# Patient Record
Sex: Female | Born: 1995 | Race: Black or African American | Hispanic: No | Marital: Single
Health system: Southern US, Community
[De-identification: ages and names within clinical notes are randomized; demographics above are authoritative.]

## PROBLEM LIST (undated history)

## (undated) HISTORY — PX: UPPER GI ENDOSCOPY: SHX6162

---
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
## 2017-12-29 ENCOUNTER — Inpatient Hospital Stay (HOSPITAL_COMMUNITY)
Admission: AD | Admit: 2017-12-29 | Discharge: 2017-12-29 | Disposition: A | Discharge status: Home / Self Care | Payer: Self-pay | Source: Ambulatory Visit | Attending: Obstetrics & Gynecology | Admitting: Obstetrics & Gynecology

## 2017-12-29 DIAGNOSIS — R102 Pelvic and perineal pain: Secondary | ICD-10-CM | POA: Insufficient documentation

## 2017-12-29 DIAGNOSIS — Z791 Long term (current) use of non-steroidal anti-inflammatories (NSAID): Secondary | ICD-10-CM | POA: Insufficient documentation

## 2017-12-29 DIAGNOSIS — Z113 Encounter for screening for infections with a predominantly sexual mode of transmission: Secondary | ICD-10-CM | POA: Insufficient documentation

## 2017-12-29 DIAGNOSIS — N9489 Other specified conditions associated with female genital organs and menstrual cycle: Secondary | ICD-10-CM

## 2017-12-29 DIAGNOSIS — N760 Acute vaginitis: Secondary | ICD-10-CM | POA: Insufficient documentation

## 2017-12-29 DIAGNOSIS — Z79899 Other long term (current) drug therapy: Secondary | ICD-10-CM | POA: Insufficient documentation

## 2017-12-29 DIAGNOSIS — B9689 Other specified bacterial agents as the cause of diseases classified elsewhere: Secondary | ICD-10-CM | POA: Insufficient documentation

## 2017-12-29 DIAGNOSIS — Z7722 Contact with and (suspected) exposure to environmental tobacco smoke (acute) (chronic): Secondary | ICD-10-CM | POA: Insufficient documentation

## 2017-12-29 LAB — URINALYSIS, ROUTINE W REFLEX MICROSCOPIC
Bacteria, UA: NONE SEEN
Bilirubin Urine: NEGATIVE
Glucose, UA: NEGATIVE mg/dL
Hgb urine dipstick: NEGATIVE
Ketones, ur: 5 mg/dL — AB
Nitrite: NEGATIVE
Protein, ur: NEGATIVE mg/dL
Specific Gravity, Urine: 1.026 (ref 1.005–1.030)
pH: 5 (ref 5.0–8.0)

## 2017-12-29 LAB — WET PREP, GENITAL
Sperm: NONE SEEN
Trich, Wet Prep: NONE SEEN
Yeast Wet Prep HPF POC: NONE SEEN

## 2017-12-29 LAB — POCT PREGNANCY, URINE: Preg Test, Ur: NEGATIVE

## 2017-12-29 MED ORDER — TINIDAZOLE 500 MG PO TABS: 2.0000 g | ORAL_TABLET | Freq: Every day | ORAL | 0 refills | Status: DC

## 2017-12-29 NOTE — MAU Note (Signed)
Pt reports milky white discharge that she noticed 5 days ago. Reports some itching and odor. Pt reports a bump on her left labia. States the skin looks swollen around that area. Reports that she did scratch that area at one point. Pt also reports some burning in her legs that started last night but worse today. Pt denies urinary s/s.

## 2017-12-29 NOTE — Discharge Instructions (Signed)
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
## 2017-12-31 LAB — GC/CHLAMYDIA PROBE AMP (~~LOC~~) NOT AT ARMC
Chlamydia: NEGATIVE
Neisseria Gonorrhea: NEGATIVE

## 2018-01-01 ENCOUNTER — Other Ambulatory Visit: Payer: Self-pay | Admitting: Certified Nurse Midwife

## 2018-01-01 DIAGNOSIS — B009 Herpesviral infection, unspecified: Secondary | ICD-10-CM

## 2018-01-01 LAB — HSV CULTURE AND TYPING

## 2018-01-01 MED ORDER — VALACYCLOVIR HCL 1 G PO TABS: 1000.0000 mg | ORAL_TABLET | Freq: Two times a day (BID) | ORAL | 1 refills | Status: DC

## 2018-01-01 MED ORDER — ACYCLOVIR 800 MG PO TABS: 800.0000 mg | ORAL_TABLET | Freq: Two times a day (BID) | ORAL | 0 refills | Status: AC

## 2018-01-01 NOTE — Progress Notes (Signed)
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 Patient was called to notify of results, allergies and pharmacy confirmed. Rx sent to pharmacy of choice for initial treatment of outbreak plus additional refill.   Sharyon Cable, CNM 01/01/2018 9:49 PM

## 2018-01-01 NOTE — Addendum Note (Signed)
Addended by: Sharyon Cable on: 01/01/2018 10:02 PM   Modules accepted: Orders

## 2018-08-21 ENCOUNTER — Other Ambulatory Visit: Payer: Self-pay

## 2018-08-21 ENCOUNTER — Emergency Department (HOSPITAL_COMMUNITY)
Admission: EM | Admit: 2018-08-21 | Discharge: 2018-08-21 | Disposition: A | Discharge status: Home / Self Care | Payer: Self-pay | Attending: Emergency Medicine | Admitting: Emergency Medicine

## 2018-08-21 ENCOUNTER — Emergency Department (HOSPITAL_COMMUNITY): Payer: Self-pay

## 2018-08-21 DIAGNOSIS — Z7722 Contact with and (suspected) exposure to environmental tobacco smoke (acute) (chronic): Secondary | ICD-10-CM | POA: Insufficient documentation

## 2018-08-21 DIAGNOSIS — R05 Cough: Secondary | ICD-10-CM | POA: Insufficient documentation

## 2018-08-21 DIAGNOSIS — J069 Acute upper respiratory infection, unspecified: Secondary | ICD-10-CM | POA: Insufficient documentation

## 2018-08-21 LAB — URINALYSIS, ROUTINE W REFLEX MICROSCOPIC
Bilirubin Urine: NEGATIVE
Glucose, UA: NEGATIVE mg/dL
Hgb urine dipstick: NEGATIVE
Ketones, ur: NEGATIVE mg/dL
Leukocytes,Ua: NEGATIVE
Nitrite: NEGATIVE
Protein, ur: NEGATIVE mg/dL
Specific Gravity, Urine: 1.001 — ABNORMAL LOW (ref 1.005–1.030)
pH: 6 (ref 5.0–8.0)

## 2018-08-21 LAB — PREGNANCY, URINE: Preg Test, Ur: NEGATIVE

## 2018-08-21 NOTE — ED Provider Notes (Signed)
[facility] EMERGENCY DEPARTMENT Provider Note   CSN: [id number] Arrival date & time: 08/21/18  1811    History   Chief Complaint Chief Complaint  Patient presents with  . Chest Pain    HPI Andrea Lowe is a 23 y.o. female.     Patient is a 23 year old female with no past medical history presenting to the emergency department for chest pain.  Patient reports that the chest pain started this morning while she was doing nothing.  Reports that she had a coughing spell although she was laughing the other day and coughed up some mucus.  She reports that the chest pain feels like pressure.  She took ibuprofen 2 hours ago and now the chest pain is gone.  Reports that her last menstrual period was over 4 weeks ago.  Denies any vaginal discharge, vaginal bleeding, abdominal pain, dysuria, hematuria.  Denies any fever, chills.  Reports that she felt like she might of had a fever but did not record her temperature.  Denies any sick contacts or recent travel.  She does smoke marijuana occasionally, last time was 3 days ago.     No past medical history on file.  There are no active problems to display for this patient.   Past Surgical History:  Procedure Laterality Date  . UPPER GI ENDOSCOPY     to remove a quarter     OB History   No obstetric history on file.      Home Medications    Prior to Admission medications   Medication Sig Start Date End Date Taking? Authorizing Provider  Chlorpheniramine-PSE-Ibuprofen (ADVIL ALLERGY SINUS) 2-30-200 MG TABS Take 2 tablets by mouth daily as needed (for allergies).    [provider]  ibuprofen (ADVIL,MOTRIN) 400 MG tablet Take 1 tablet (400 mg total) by mouth every 6 (six) hours as needed for mild pain. 04/06/13   Marcellina Millin, MD  tinidazole (TINDAMAX) 500 MG tablet Take 4 tablets (2,000 mg total) by mouth daily with breakfast. For two days 12/29/17   Sharyon Cable, CNM  valACYclovir (VALTREX) 1000 MG tablet  Take 1 tablet (1,000 mg total) by mouth 2 (two) times daily. Take for ten days. 01/01/18   Sharyon Cable, CNM    Family History No family history on file.  Social History Social History   Tobacco Use  . Smoking status: Passive Smoke Exposure - Never Smoker  Substance Use Topics  . Alcohol use: Not on file  . Drug use: Not on file     Allergies   Patient has no known allergies.   Review of Systems Review of Systems  Constitutional: Negative for appetite change, chills, diaphoresis and fever.  HENT: Negative for congestion, ear pain, nosebleeds, rhinorrhea and sore throat.   Eyes: Negative for pain and visual disturbance.  Respiratory: Positive for cough. Negative for apnea, chest tightness, shortness of breath and wheezing.   Cardiovascular: Positive for chest pain. Negative for palpitations and leg swelling.  Gastrointestinal: Negative for abdominal pain, diarrhea, nausea and vomiting.  Genitourinary: Negative for difficulty urinating, dysuria, hematuria, vaginal bleeding, vaginal discharge and vaginal pain.  Musculoskeletal: Negative for arthralgias and back pain.  Skin: Negative for color change and rash.  Allergic/Immunologic: Negative for environmental allergies, food allergies and immunocompromised state.  Neurological: Negative for dizziness, seizures, syncope and headaches.  All other systems reviewed and are negative.    Physical Exam Updated Vital Signs BP 130/74 (BP Location: Right Arm)   Pulse Marland Kitchen)  107   Temp 98.5 F (36.9 C) (Oral)   Resp 16   Ht 5\' 4"  (1.626 m)   Wt 61.2 kg   LMP 07/10/2018   SpO2 98%   BMI 23.17 kg/m   Physical Exam Vitals signs and nursing note reviewed.  Constitutional:      General: She is not in acute distress.    Appearance: She is well-developed.  HENT:     Head: Normocephalic and atraumatic.  Eyes:     Conjunctiva/sclera: Conjunctivae normal.     Pupils: Pupils are equal, round, and reactive to light.  Neck:      Musculoskeletal: Neck supple.  Cardiovascular:     Rate and Rhythm: Normal rate and regular rhythm.     Heart sounds: No murmur.  Pulmonary:     Effort: Pulmonary effort is normal. No respiratory distress.     Breath sounds: Normal breath sounds. No decreased breath sounds, wheezing, rhonchi or rales.  Chest:     Chest wall: No mass or tenderness.  Abdominal:     Palpations: Abdomen is soft.     Tenderness: There is no abdominal tenderness.  Skin:    General: Skin is warm and dry.     Capillary Refill: Capillary refill takes less than 2 seconds.  Neurological:     General: No focal deficit present.     Mental Status: She is alert.      ED Treatments / Results  Labs (all labs ordered are listed, but only abnormal results are displayed) Labs Reviewed  URINALYSIS, ROUTINE W REFLEX MICROSCOPIC - Abnormal; Notable for the following components:      Result Value   Color, Urine STRAW (*)    APPearance HAZY (*)    Specific Gravity, Urine 1.001 (*)    All other components within normal limits  PREGNANCY, URINE    EKG EKG Interpretation  Date/Time:  Thursday August 21 2018 18:54:12 EDT Ventricular Rate:  95 PR Interval:    QRS Duration: 59 QT Interval:  321 QTC Calculation: 404 R Axis:   84 Text Interpretation:  Sinus rhythm Right atrial enlargement since last tracing no significant change Confirmed by Mancel BaleWentz, Elliott [phone]([id number]) on 08/21/2018 6:58:12 PM   Radiology Dg Chest Portable 1 View  Result Date: 08/21/2018 CLINICAL DATA:  Chest pain EXAM: PORTABLE CHEST 1 VIEW COMPARISON:  None. FINDINGS: Heart and mediastinal contours are within normal limits. No focal opacities or effusions. No acute bony abnormality. IMPRESSION: No active cardiopulmonary disease. Electronically Signed   By: Charlett NoseKevin  Dover M.D.   On: 08/21/2018 19:08    Procedures Procedures (including critical care time)  Medications Ordered in ED Medications - No data to display   Initial Impression / Assessment  and Plan / ED Course  I have reviewed the triage vital signs and the nursing notes.  Pertinent labs & imaging results that were available during my care of the patient were reviewed by me and considered in my medical decision making (see chart for details).  Clinical Course as of Aug 20 1921  Thu Aug 21, 2018  [id number] Patient likely has viral URI given her symptoms.  Currently while in the emergency department she is symptom-free.  She has been afebrile.  Due to our attesting restrictions, I cannot rule out coronavirus.  This was discussed with the patient and it was discussed with her about home, self quarantine.  And return precautions.Olam Idler.   Andrea Lowe was evaluated in Emergency Department on 08/21/2018 for the symptoms described  in the history of present illness. She was evaluated in the context of the global COVID-19 pandemic, which necessitated consideration that the patient might be at risk for infection with the SARS-CoV-2 virus that causes COVID-19. Institutional protocols and algorithms that pertain to the evaluation of patients at risk for COVID-19 are in a state of rapid change based on information released by regulatory bodies including the CDC and federal and state organizations. These policies and algorithms were followed during the patient's care in the ED.     [KM]    Clinical Course User Index [KM] Arlyn Dunning, PA-C       Based on review of vitals, medical screening exam, lab work and/or imaging, there does not appear to be an acute, emergent etiology for the patient's symptoms. Counseled pt on good return precautions and encouraged both PCP and ED follow-up as needed.  Prior to discharge, I also discussed incidental imaging findings with patient in detail and advised appropriate, recommended follow-up in detail.  Clinical Impression: 1. Acute URI     Disposition: Discharge  Prior to providing a prescription for a controlled substance, I independently reviewed the  patient's recent prescription history on the [place] Controlled Substance Reporting System. The patient had no recent or regular prescriptions and was deemed appropriate for a brief, less than 3 day prescription of narcotic for acute analgesia.  This note was prepared with assistance of Conservation officer, historic buildings. Occasional wrong-word or sound-a-like substitutions may have occurred due to the inherent limitations of voice recognition software.   Final Clinical Impressions(s) / ED Diagnoses   Final diagnoses:  Acute URI    ED Discharge Orders    None       Jeral Pinch 08/21/18 Margretta Ditty    Mancel Bale, MD 08/22/18 [phone]

## 2018-08-21 NOTE — Discharge Instructions (Addendum)
Unfortunately we do not have enough tests to test you for the coronavirus.  Your symptoms are very mild but we cannot rule out the coronavirus so please self quarantine for 7 days from symptom onset and until you are fever free for at least 72 hours    Stay home except to get medical care You should restrict activities outside your home, except for getting medical care. Do not go to work, school, or public areas, and do not use public transportation or taxis.  Call ahead before visiting your doctor Before your medical appointment, call the healthcare provider and tell them that you have viral illness symptoms  Monitor your symptoms Seek prompt medical attention if your illness is worsening (e.g., difficulty breathing).   Wear a facemask You should wear a facemask that covers your nose and mouth when you are in the same room with other people and when you visit a healthcare provider. People who live with or visit you should also wear a facemask while they are in the same room with you.  Separate yourself from other people in your home As much as possible, you should stay in a different room from other people in your home. Also, you should use a separate bathroom, if available.  Avoid sharing household items You should not share dishes, drinking glasses, cups, eating utensils, towels, bedding, or other items with other people in your home. After using these items, you should wash them thoroughly with soap and water.  Cover your coughs and sneezes Cover your mouth and nose with a tissue when you cough or sneeze, or you can cough or sneeze into your sleeve. Throw used tissues in a lined trash can, and immediately wash your hands with soap and water for at least 20 seconds or use an alcohol-based hand rub.  Wash your [employer] your hands often and thoroughly with soap and water for at least 20 seconds. You can use an alcohol-based hand sanitizer if soap and water are not available and if your  hands are not visibly dirty. Avoid touching your eyes, nose, and mouth with unwashed hands.   Thank you for allowing me to care for you today. Please return to the emergency department if you have new or worsening symptoms. Take your medications as instructed.

## 2018-08-21 NOTE — ED Triage Notes (Addendum)
Pt c/o chest tightness that began this morning ; denies any SOB; states she had a slight cough and a runny nose a couple of days ago ; but denies any further cough or runny nose at this time

## 2020-02-11 IMAGING — CR PORTABLE CHEST - 1 VIEW
1 series · 1 of 1 positions shown · non-contrast
Comparison: None.

CLINICAL DATA: Chest pain

EXAM:
PORTABLE CHEST 1 VIEW

[AP]
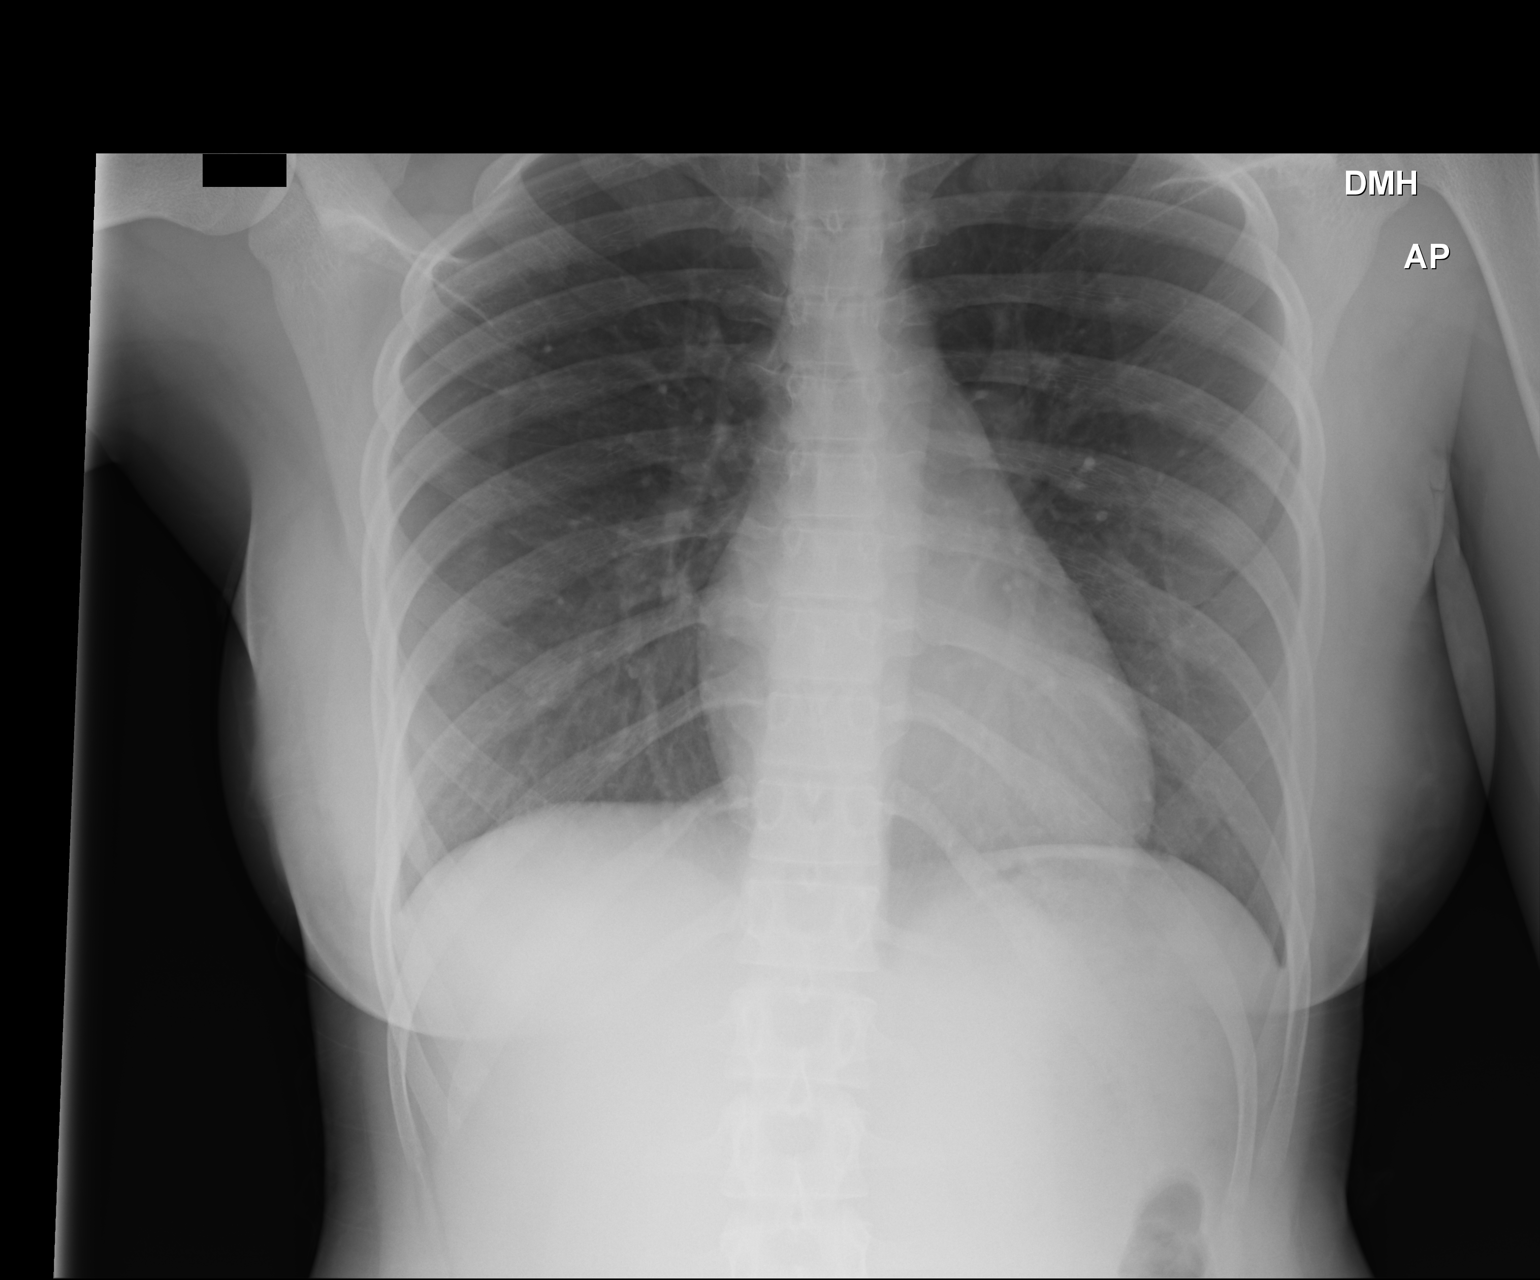

[1 of 1 positions shown; findings below may reference images not displayed]

FINDINGS: Heart and mediastinal contours are within normal limits. No focal
opacities or effusions. No acute bony abnormality.
IMPRESSION: No active cardiopulmonary disease.

## 2020-03-22 ENCOUNTER — Emergency Department (HOSPITAL_COMMUNITY)
Admission: EM | Admit: 2020-03-22 | Discharge: 2020-03-22 | Disposition: A | Discharge status: Home / Self Care | Payer: Self-pay | Attending: Emergency Medicine | Admitting: Emergency Medicine

## 2020-03-22 ENCOUNTER — Encounter (HOSPITAL_COMMUNITY): Payer: Self-pay

## 2020-03-22 DIAGNOSIS — N12 Tubulo-interstitial nephritis, not specified as acute or chronic: Secondary | ICD-10-CM | POA: Insufficient documentation

## 2020-03-22 DIAGNOSIS — Z7722 Contact with and (suspected) exposure to environmental tobacco smoke (acute) (chronic): Secondary | ICD-10-CM | POA: Insufficient documentation

## 2020-03-22 DIAGNOSIS — B3731 Acute candidiasis of vulva and vagina: Secondary | ICD-10-CM

## 2020-03-22 DIAGNOSIS — B373 Candidiasis of vulva and vagina: Secondary | ICD-10-CM | POA: Insufficient documentation

## 2020-03-22 LAB — COMPREHENSIVE METABOLIC PANEL
ALT: 12 U/L (ref 0–44)
AST: 16 U/L (ref 15–41)
Albumin: 3.8 g/dL (ref 3.5–5.0)
Alkaline Phosphatase: 53 U/L (ref 38–126)
Anion gap: 8 (ref 5–15)
BUN: 12 mg/dL (ref 6–20)
CO2: 23 mmol/L (ref 22–32)
Calcium: 9.3 mg/dL (ref 8.9–10.3)
Chloride: 103 mmol/L (ref 98–111)
Creatinine, Ser: 0.88 mg/dL (ref 0.44–1.00)
GFR, Estimated: >60 mL/min (ref >60)
Glucose, Bld: 108 mg/dL — ABNORMAL HIGH (ref 70–99)
Potassium: 4.6 mmol/L (ref 3.5–5.1)
Sodium: 134 mmol/L — ABNORMAL LOW (ref 135–145)
Total Bilirubin: 0.8 mg/dL (ref 0.3–1.2)
Total Protein: 7 g/dL (ref 6.5–8.1)

## 2020-03-22 LAB — URINALYSIS, ROUTINE W REFLEX MICROSCOPIC
Bilirubin Urine: NEGATIVE
Glucose, UA: NEGATIVE mg/dL
Ketones, ur: NEGATIVE mg/dL
Nitrite: NEGATIVE
Protein, ur: 30 mg/dL — AB
Specific Gravity, Urine: 1.014 (ref 1.005–1.030)
WBC, UA: >50 WBC/hpf — ABNORMAL HIGH (ref 0–5)
pH: 5 (ref 5.0–8.0)

## 2020-03-22 LAB — CBC
HCT: 40.9 % (ref 36.0–46.0)
Hemoglobin: 13.1 g/dL (ref 12.0–15.0)
MCH: 30.3 pg (ref 26.0–34.0)
MCHC: 32 g/dL (ref 30.0–36.0)
MCV: 94.7 fL (ref 80.0–100.0)
Platelets: 215 10*3/uL (ref 150–400)
RBC: 4.32 MIL/uL (ref 3.87–5.11)
RDW: 11.9 % (ref 11.5–15.5)
WBC: 13.2 10*3/uL — ABNORMAL HIGH (ref 4.0–10.5)
nRBC: 0 % (ref 0.0–0.2)

## 2020-03-22 LAB — I-STAT BETA HCG BLOOD, ED (MC, WL, AP ONLY): I-stat hCG, quantitative: <5 m[IU]/mL (ref <5)

## 2020-03-22 LAB — WET PREP, GENITAL
Clue Cells Wet Prep HPF POC: NONE SEEN
Sperm: NONE SEEN
Trich, Wet Prep: NONE SEEN
Yeast Wet Prep HPF POC: NONE SEEN

## 2020-03-22 LAB — LIPASE, BLOOD: Lipase: 22 U/L (ref 11–51)

## 2020-03-22 MED ORDER — FLUCONAZOLE 200 MG PO TABS: 200.0000 mg | ORAL_TABLET | Freq: Every day | ORAL | 0 refills | Status: AC

## 2020-03-22 MED ORDER — ACETAMINOPHEN 500 MG PO TABS
1000.0000 mg | ORAL_TABLET | Freq: Once | ORAL | Status: AC
  Administered 2020-03-22: 1000 mg via ORAL
  Filled 2020-03-22: qty 2

## 2020-03-22 MED ORDER — CEPHALEXIN 500 MG PO CAPS: 500.0000 mg | ORAL_CAPSULE | Freq: Four times a day (QID) | ORAL | 0 refills | Status: DC

## 2020-03-22 NOTE — ED Provider Notes (Addendum)
[facility] EMERGENCY DEPARTMENT Provider Note   CSN: [id number] Arrival date & time: 03/22/20  1350     History Chief Complaint  Patient presents with  . Abdominal Pain  . Vaginal Discharge    Andrea Lowe is a 24 y.o. female.  The history is provided by the patient.  Abdominal Pain Pain location:  Suprapubic Pain quality: aching and cramping   Pain radiates to:  L flank Pain severity:  Moderate Onset quality:  Gradual Duration:  1 week Timing:  Constant Progression:  Waxing and waning Chronicity:  New Context comment:  Patient reports that she had a urinary tract infection 2 weeks ago and [facility].  At that time she had a pelvic exam done which was negative for gonorrhea and chlamydia and she was given metronidazole for her urinary tract infection.   Relieved by:  NSAIDs Worsened by:  Nothing Ineffective treatments:  None tried Associated symptoms: anorexia, hematuria, nausea and vaginal discharge   Associated symptoms: no chills, no cough, no diarrhea, no dysuria, no fever and no vaginal bleeding   Associated symptoms comment:  Patient reports that she was having dysuria, frequency and urgency over 2 weeks ago and she was given metronidazole when she went to the health department.  She completed the antibiotic approximately 1 week ago.  She reports she does not have dysuria or frequency anymore but continues to have pain in her lower pelvic region and off to the left side that radiates up into her left back.  She had one episode of vomiting this morning and nausea but has not had ongoing vomiting.  She has been taking ibuprofen daily which does help with the pain.  She denies any vaginal bleeding but has noticed some thin vaginal discharge.  She does report that she was negative for gonorrhea and chlamydia last week when she was seen at the health department.  No prior history of kidney stones. Risk factors: has not had multiple surgeries  and not pregnant   Vaginal Discharge Associated symptoms: abdominal pain and nausea   Associated symptoms: no dysuria and no fever        History reviewed. No pertinent past medical history.  There are no problems to display for this patient.   Past Surgical History:  Procedure Laterality Date  . UPPER GI ENDOSCOPY     to remove a quarter     OB History   No obstetric history on file.     No family history on file.  Social History   Tobacco Use  . Smoking status: Passive Smoke Exposure - Never Smoker  Substance Use Topics  . Alcohol use: Not on file  . Drug use: Not on file    Home Medications Prior to Admission medications   Medication Sig Start Date End Date Taking? Authorizing Provider  Chlorpheniramine-PSE-Ibuprofen (ADVIL ALLERGY SINUS) 2-30-200 MG TABS Take 2 tablets by mouth daily as needed (for allergies).    [provider]  ibuprofen (ADVIL,MOTRIN) 400 MG tablet Take 1 tablet (400 mg total) by mouth every 6 (six) hours as needed for mild pain. 04/06/13   Marcellina Millin, MD  tinidazole (TINDAMAX) 500 MG tablet Take 4 tablets (2,000 mg total) by mouth daily with breakfast. For two days 12/29/17   Sharyon Cable, CNM  valACYclovir (VALTREX) 1000 MG tablet Take 1 tablet (1,000 mg total) by mouth 2 (two) times daily. Take for ten days. 01/01/18   Sharyon Cable, CNM  Allergies    Patient has no known allergies.  Review of Systems   Review of Systems  Constitutional: Negative for chills and fever.  Respiratory: Negative for cough.   Gastrointestinal: Positive for abdominal pain, anorexia and nausea. Negative for diarrhea.  Genitourinary: Positive for hematuria and vaginal discharge. Negative for dysuria and vaginal bleeding.  All other systems reviewed and are negative.   Physical Exam Updated Vital Signs BP 112/66 (BP Location: Right Arm)   Pulse 87   Temp 98.7 F (37.1 C) (Oral)   Resp 17   Ht 5\' 4"  (1.626 m)   Wt 64 kg   SpO2 99%    BMI 24.20 kg/m   Physical Exam Vitals and nursing note reviewed.  Constitutional:      General: She is not in acute distress.    Appearance: She is well-developed and normal weight.  HENT:     Head: Normocephalic and atraumatic.  Eyes:     Pupils: Pupils are equal, round, and reactive to light.  Cardiovascular:     Rate and Rhythm: Normal rate and regular rhythm.     Heart sounds: Normal heart sounds. No murmur heard.  No friction rub.  Pulmonary:     Effort: Pulmonary effort is normal.     Breath sounds: Normal breath sounds. No wheezing or rales.  Abdominal:     General: Bowel sounds are normal. There is no distension.     Palpations: Abdomen is soft.     Tenderness: There is abdominal tenderness in the suprapubic area and left lower quadrant. There is left CVA tenderness. There is no guarding or rebound.  Genitourinary:    Vagina: Vaginal discharge present. No bleeding.     Cervix: Discharge present. No cervical motion tenderness or friability.     Uterus: Normal.      Adnexa:        Right: Tenderness present.        Left: Tenderness present.      Comments: Curd-like discharge.  Minimal bilateral adnexal tenderness without chandelier sign Musculoskeletal:        General: No tenderness. Normal range of motion.     Comments: No edema  Skin:    General: Skin is warm and dry.     Findings: No rash.  Neurological:     Mental Status: She is alert and oriented to person, place, and time.     Cranial Nerves: No cranial nerve deficit.  Psychiatric:        Behavior: Behavior normal.     ED Results / Procedures / Treatments   Labs (all labs ordered are listed, but only abnormal results are displayed) Labs Reviewed  COMPREHENSIVE METABOLIC PANEL - Abnormal; Notable for the following components:      Result Value   Sodium 134 (*)    Glucose, Bld 108 (*)    All other components within normal limits  CBC - Abnormal; Notable for the following components:   WBC 13.2 (*)     All other components within normal limits  URINALYSIS, ROUTINE W REFLEX MICROSCOPIC - Abnormal; Notable for the following components:   APPearance CLOUDY (*)    Hgb urine dipstick MODERATE (*)    Protein, ur 30 (*)    Leukocytes,Ua LARGE (*)    WBC, UA >50 (*)    Bacteria, UA MANY (*)    All other components within normal limits  WET PREP, GENITAL  URINE CULTURE  LIPASE, BLOOD  I-STAT BETA HCG BLOOD, ED ([facility], [facility],  [facility] ONLY)  GC/CHLAMYDIA PROBE AMP ([place]) NOT AT [facility]    EKG None  Radiology No results found.  Procedures Procedures (including critical care time)  Medications Ordered in ED Medications - No data to display  ED Course  I have reviewed the triage vital signs and the nursing notes.  Pertinent labs & imaging results that were available during my care of the patient were reviewed by me and considered in my medical decision making (see chart for details).    MDM Rules/Calculators/A&P                          Patient is a healthy 24 year old female who presents today with symptoms most concerning for pyelonephritis.  Patient reports UTI type symptoms 2 weeks ago.  At that time she was seen at the health department had a pelvic exam with negative cultures and was treated with metronidazole.  She reports the dysuria and frequency have improved but the discomfort in her left pelvic area and left back are gradually worsening.  She is having some vaginal discharge but denies any bleeding.  No prior history of kidney stones.  Urine is consistent with UTI with greater than 50 white blood cells, many bacteria and moderate hemoglobin without signs of contamination.  Mild leukocytosis of 13,000 but normal lipase, hCG and CMP.  We will do pelvic exam to ensure no concern for TOA but exam is not consistent with torsion.  Lower suspicion for renal stone given patient has no prior history and was not adequately treated for UTI prior.  Urine culture added.  GC chlamydia  pending.  5:33 PM No vaginal findings concerning for TOA.  Discharge consistent with yeast infection.  Will treat with diflucan and keflex.  Pt given return precautions.  Final Clinical Impression(s) / ED Diagnoses Final diagnoses:  Pyelonephritis  Yeast vaginitis    Rx / DC Orders ED Discharge Orders         Ordered    fluconazole (DIFLUCAN) 200 MG tablet  Daily        03/22/20 1735    cephALEXin (KEFLEX) 500 MG capsule  4 times daily        03/22/20 1735           Gwyneth Sprout, MD 03/22/20 1735    Gwyneth Sprout, MD 03/22/20 [phone]

## 2020-03-22 NOTE — ED Triage Notes (Signed)
Pt reports left sided abd pain and vaginal discharge for the past week.

## 2020-03-22 NOTE — Discharge Instructions (Signed)
If you start having worsening pain, vomiting or high fever please return.

## 2020-03-23 LAB — GC/CHLAMYDIA PROBE AMP (~~LOC~~) NOT AT ARMC
Chlamydia: NEGATIVE
Comment: NEGATIVE
Comment: NORMAL
Neisseria Gonorrhea: NEGATIVE

## 2020-03-25 LAB — URINE CULTURE: Culture: >=100000 — AB

## 2020-03-27 ENCOUNTER — Telehealth (HOSPITAL_BASED_OUTPATIENT_CLINIC_OR_DEPARTMENT_OTHER): Payer: Self-pay | Admitting: Emergency Medicine

## 2020-03-27 NOTE — Telephone Encounter (Signed)
Post ED Visit - Positive Culture Follow-up  Culture report reviewed by antimicrobial stewardship pharmacist: [facility] []  , Pharm.D. []  Enzo Bi, Pharm.D., BCPS AQ-ID []  , Pharm.D., BCPS []  Celedonio Miyamoto, Pharm.D., BCPS []  Mentor, Garvin Fila.D., BCPS, AAHIVP []  , Pharm.D., BCPS, AAHIVP [x]  Georgina Pillion, PharmD, BCPS []  , PharmD, BCPS []  [place], PharmD, BCPS []  [place], PharmD []  , PharmD, BCPS []  Estella Husk, PharmD  Pharmacy Team []  Lysle Pearl, PharmD []  , PharmD []  Phillips Climes, PharmD []  , Rph []  Agapito Games) , PharmD []  Verlan Friends, PharmD []  , PharmD []  Mervyn Gay, PharmD []  , PharmD []  Vinnie Level, PharmD []  Wonda [facility], PharmD []  , PharmD []  Len Childs, PharmD   Positive urine culture Treated with Cephalexin, organism sensitive to the same and no further patient follow-up is required at this time.  Malin Sambrano 03/27/2020, 12:20 PM

## 2020-04-24 ENCOUNTER — Encounter (HOSPITAL_COMMUNITY): Payer: Self-pay

## 2020-04-24 ENCOUNTER — Other Ambulatory Visit: Payer: Self-pay

## 2020-04-24 ENCOUNTER — Emergency Department (HOSPITAL_COMMUNITY)
Admission: EM | Admit: 2020-04-24 | Discharge: 2020-04-24 | Discharge status: Against Medical Advice | Payer: Self-pay | Attending: Emergency Medicine | Admitting: Emergency Medicine

## 2020-04-24 DIAGNOSIS — Z5321 Procedure and treatment not carried out due to patient leaving prior to being seen by health care provider: Secondary | ICD-10-CM | POA: Insufficient documentation

## 2020-04-24 DIAGNOSIS — Z7722 Contact with and (suspected) exposure to environmental tobacco smoke (acute) (chronic): Secondary | ICD-10-CM | POA: Insufficient documentation

## 2020-04-24 DIAGNOSIS — N939 Abnormal uterine and vaginal bleeding, unspecified: Secondary | ICD-10-CM | POA: Insufficient documentation

## 2020-04-24 DIAGNOSIS — Z7982 Long term (current) use of aspirin: Secondary | ICD-10-CM | POA: Insufficient documentation

## 2020-04-24 LAB — URINALYSIS, ROUTINE W REFLEX MICROSCOPIC
Bacteria, UA: NONE SEEN
Bilirubin Urine: NEGATIVE
Glucose, UA: NEGATIVE mg/dL
Hgb urine dipstick: NEGATIVE
Ketones, ur: NEGATIVE mg/dL
Nitrite: NEGATIVE
Protein, ur: NEGATIVE mg/dL
Specific Gravity, Urine: 1.024 (ref 1.005–1.030)
pH: 5 (ref 5.0–8.0)

## 2020-04-24 LAB — WET PREP, GENITAL
Sperm: NONE SEEN
Trich, Wet Prep: NONE SEEN
Yeast Wet Prep HPF POC: NONE SEEN

## 2020-04-24 LAB — PREGNANCY, URINE: Preg Test, Ur: NEGATIVE

## 2020-04-24 NOTE — ED Notes (Signed)
Patient has not returned to the room, patient eloped from ER.

## 2020-04-24 NOTE — ED Provider Notes (Signed)
[facility] EMERGENCY DEPARTMENT Provider Note   CSN: [id number] Arrival date & time: 04/24/20  1511     History No chief complaint on file.   Andrea Lowe is a 24 y.o. female with recent past medical history of pyelonephritis who presents today for evaluation of multiple complaints.  She states that she finished her antibiotics 2 weeks ago and is having pelvic pressure.  She states that she is currently having vaginal bleeding however she is not due to start her cycle for 5 days.  Additionally she reports that today she inserted her fingers into her vagina and felt a abnormal lump there.  She denies any fevers.  She denies dysuria, increased frequency or urgency. She also requests that I "check my kidneys" as she states that when she drinks alcohol that makes her back hurt in the same area as it hurt when her kidney was infected.  She states she has not been sexually active for many months.  She says that she was given pills for yeast infection, that resolved however now she feels like her yeast infection is back.  HPI     History reviewed. No pertinent past medical history.  There are no problems to display for this patient.   Past Surgical History:  Procedure Laterality Date  . UPPER GI ENDOSCOPY     to remove a quarter     OB History   No obstetric history on file.     No family history on file.  Social History   Tobacco Use  . Smoking status: Passive Smoke Exposure - Never Smoker    Home Medications Prior to Admission medications   Medication Sig Start Date End Date Taking? Authorizing Provider  aspirin 500 MG EC tablet Take 500 mg by mouth 2 (two) times daily as needed for pain.   Yes [provider]  Multiple Vitamins-Minerals (MULTIVITAMIN WITH MINERALS) tablet Take 1 tablet by mouth daily.   Yes [provider]  nitrofurantoin, macrocrystal-monohydrate, (MACROBID) 100 MG capsule Take 100 mg by mouth 2 (two) times daily.  04/12/20   [provider]    Allergies    Patient has no known allergies.  Review of Systems   Review of Systems  Constitutional: Negative for chills and fever.  Respiratory: Negative for shortness of breath.   Cardiovascular: Negative for chest pain.  Gastrointestinal: Negative for abdominal pain.  Genitourinary: Positive for flank pain and vaginal bleeding. Negative for dysuria, frequency, urgency and vaginal discharge.       "lump in my vagina"  Musculoskeletal: Positive for back pain.  All other systems reviewed and are negative.   Physical Exam Updated Vital Signs BP 115/74   Pulse (!) 101   Temp 98.7 F (37.1 C) (Oral)   Resp 19   SpO2 100%   Physical Exam Vitals and nursing note reviewed. Exam conducted with a chaperone present Government social research officer).  Constitutional:      General: She is not in acute distress.    Appearance: She is not diaphoretic.  HENT:     Head: Normocephalic and atraumatic.  Eyes:     General: No scleral icterus.       Right eye: No discharge.        Left eye: No discharge.     Conjunctiva/sclera: Conjunctivae normal.  Cardiovascular:     Rate and Rhythm: Normal rate and regular rhythm.  Pulmonary:     Effort: Pulmonary effort is normal. No respiratory distress.  Breath sounds: No stridor.  Abdominal:     General: There is no distension.  Genitourinary:    Comments: Normal external female genitalia. There is no visualized foreign body in the vaginal canal.  There is scant bleeding coming from the cervical os. There is no abnormal mass visualized.  No tenderness or fullness to bilateral adnexa. There is thick white discharge adhered to the sidewalls of the vagina. Musculoskeletal:        General: No deformity.     Cervical back: Normal range of motion.  Skin:    General: Skin is warm and dry.  Neurological:     Mental Status: She is alert.     Motor: No abnormal muscle tone.  Psychiatric:        Behavior: Behavior normal.      ED Results / Procedures / Treatments   Labs (all labs ordered are listed, but only abnormal results are displayed) Labs Reviewed  WET PREP, GENITAL - Abnormal; Notable for the following components:      Result Value   Clue Cells Wet Prep HPF POC PRESENT (*)    WBC, Wet Prep HPF POC MANY (*)    All other components within normal limits  URINALYSIS, ROUTINE W REFLEX MICROSCOPIC - Abnormal; Notable for the following components:   APPearance HAZY (*)    Leukocytes,Ua LARGE (*)    All other components within normal limits  PREGNANCY, URINE  GC/CHLAMYDIA PROBE AMP () NOT AT [facility]    EKG None  Radiology No results found.  Procedures Procedures (including critical care time)  Medications Ordered in ED Medications - No data to display  ED Course  I have reviewed the triage vital signs and the nursing notes.  Pertinent labs & imaging results that were available during my care of the patient were reviewed by me and considered in my medical decision making (see chart for details).    MDM Rules/Calculators/A&P                         Today for concern of recurrent UTI, yeast infection and states that she felt a mass in her vagina.  Clinically on exam patient appears to have a yeast infection.  Wet prep was obtained which is consistent with yeast infection and BV.  I suspect this is related due to patient's recent antibiotic use.   Patient does have blood in her urine, however it is also contaminated with no bacteria seen.  No evidence of UTI, she is only having the pain in her back occasionally and notes that it is worse if she drinks alcohol.  I recommended decreasing alcohol intake and increasing water intake.  Regarding patient's abnormal mass that she felt in her vagina I suspect that this is her cervix.  I explained to her that this is a normal part of female anatomy, recommended obtaining routine outpatient follow up and making sure she gets paps per  guidelines.  Prior to wet prep resulting patent eloped from the ER.  I was unable to give her her results.   Note: Portions of this report may have been transcribed using voice recognition software. Every effort was made to ensure accuracy; however, inadvertent computerized transcription errors may be present   Final Clinical Impression(s) / ED Diagnoses Final diagnoses:  Abnormal vaginal bleeding    Rx / DC Orders ED Discharge Orders    None       Cristina Gong, PA-C 04/25/20 0004  Terrilee Files, MD 04/25/20 [phone]

## 2020-04-24 NOTE — ED Notes (Signed)
Patient states she needs to go get something from the car, patient informed she is not up for discharge yet, she states she still needs to go to her car.

## 2020-04-24 NOTE — ED Triage Notes (Signed)
Patient reports that she recently finished antibiotic 2 weeks ago for UTI. Patient also complains of pelvic pressure and thinks she may have yeast infection

## 2020-04-25 LAB — GC/CHLAMYDIA PROBE AMP (~~LOC~~) NOT AT ARMC
Chlamydia: NEGATIVE
Comment: NEGATIVE
Comment: NORMAL
Neisseria Gonorrhea: NEGATIVE

## 2023-02-20 ENCOUNTER — Ambulatory Visit (INDEPENDENT_AMBULATORY_CARE_PROVIDER_SITE_OTHER): Payer: 59 | Admitting: Primary Care

## 2023-02-28 DIAGNOSIS — F1020 Alcohol dependence, uncomplicated: Secondary | ICD-10-CM | POA: Diagnosis not present

## 2023-03-07 DIAGNOSIS — F1020 Alcohol dependence, uncomplicated: Secondary | ICD-10-CM | POA: Diagnosis not present

## 2023-03-14 DIAGNOSIS — F1020 Alcohol dependence, uncomplicated: Secondary | ICD-10-CM | POA: Diagnosis not present
